# Patient Record
Sex: Male | Born: 1980 | Race: Black or African American | Hispanic: No | Marital: Married | Smoking: Never smoker
Health system: Southern US, Community
[De-identification: ages and names within clinical notes are randomized; demographics above are authoritative.]

## PROBLEM LIST (undated history)

## (undated) HISTORY — PX: ABDOMINAL SURGERY: SHX537

## (undated) HISTORY — PX: BACK SURGERY: SHX140

## (undated) HISTORY — PX: TONSILLECTOMY: SUR1361

---
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
## 2000-01-08 ENCOUNTER — Emergency Department (HOSPITAL_COMMUNITY): Admission: EM | Admit: 2000-01-08 | Discharge: 2000-01-08 | Payer: Self-pay

## 2000-08-13 ENCOUNTER — Encounter: Payer: Self-pay | Admitting: Internal Medicine

## 2000-08-13 ENCOUNTER — Emergency Department (HOSPITAL_COMMUNITY): Admission: EM | Admit: 2000-08-13 | Discharge: 2000-08-13 | Payer: Self-pay | Admitting: *Deleted

## 2001-08-02 ENCOUNTER — Emergency Department (HOSPITAL_COMMUNITY): Admission: EM | Admit: 2001-08-02 | Discharge: 2001-08-02 | Payer: Self-pay | Admitting: *Deleted

## 2001-09-28 ENCOUNTER — Emergency Department (HOSPITAL_COMMUNITY): Admission: EM | Admit: 2001-09-28 | Discharge: 2001-09-28 | Payer: Self-pay | Admitting: Emergency Medicine

## 2001-12-17 ENCOUNTER — Emergency Department (HOSPITAL_COMMUNITY): Admission: EM | Admit: 2001-12-17 | Discharge: 2001-12-17 | Payer: Self-pay | Admitting: Emergency Medicine

## 2001-12-17 ENCOUNTER — Encounter: Payer: Self-pay | Admitting: Emergency Medicine

## 2002-05-15 ENCOUNTER — Emergency Department (HOSPITAL_COMMUNITY): Admission: EM | Admit: 2002-05-15 | Discharge: 2002-05-16 | Payer: Self-pay | Admitting: Emergency Medicine

## 2002-05-16 ENCOUNTER — Encounter: Payer: Self-pay | Admitting: Emergency Medicine

## 2002-06-06 ENCOUNTER — Emergency Department (HOSPITAL_COMMUNITY): Admission: EM | Admit: 2002-06-06 | Discharge: 2002-06-06 | Payer: Self-pay | Admitting: Emergency Medicine

## 2002-07-06 ENCOUNTER — Emergency Department (HOSPITAL_COMMUNITY): Admission: EM | Admit: 2002-07-06 | Discharge: 2002-07-06 | Payer: Self-pay | Admitting: Emergency Medicine

## 2002-09-11 ENCOUNTER — Encounter: Payer: Self-pay | Admitting: Emergency Medicine

## 2002-09-11 ENCOUNTER — Emergency Department (HOSPITAL_COMMUNITY): Admission: EM | Admit: 2002-09-11 | Discharge: 2002-09-12 | Payer: Self-pay | Admitting: Emergency Medicine

## 2002-09-15 ENCOUNTER — Emergency Department (HOSPITAL_COMMUNITY): Admission: EM | Admit: 2002-09-15 | Discharge: 2002-09-15 | Payer: Self-pay | Admitting: Emergency Medicine

## 2002-09-26 ENCOUNTER — Encounter: Payer: Self-pay | Admitting: Emergency Medicine

## 2002-09-26 ENCOUNTER — Emergency Department (HOSPITAL_COMMUNITY): Admission: EM | Admit: 2002-09-26 | Discharge: 2002-09-26 | Payer: Self-pay | Admitting: Emergency Medicine

## 2002-10-20 ENCOUNTER — Emergency Department (HOSPITAL_COMMUNITY): Admission: EM | Admit: 2002-10-20 | Discharge: 2002-10-20 | Payer: Self-pay | Admitting: Emergency Medicine

## 2002-10-20 ENCOUNTER — Encounter: Payer: Self-pay | Admitting: Emergency Medicine

## 2005-03-18 ENCOUNTER — Emergency Department (HOSPITAL_COMMUNITY): Admission: EM | Admit: 2005-03-18 | Discharge: 2005-03-18 | Payer: Self-pay | Admitting: Emergency Medicine

## 2006-08-04 ENCOUNTER — Inpatient Hospital Stay (HOSPITAL_COMMUNITY): Admission: EM | Admit: 2006-08-04 | Discharge: 2006-08-06 | Payer: Self-pay | Admitting: Emergency Medicine

## 2007-08-19 ENCOUNTER — Emergency Department (HOSPITAL_COMMUNITY): Admission: EM | Admit: 2007-08-19 | Discharge: 2007-08-19 | Payer: Self-pay | Admitting: Emergency Medicine
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
## 2010-06-09 NOTE — Discharge Summary (Signed)
NAMEGARRIE, Connor Mcdonald                 ACCOUNT NO.:  [id number]   MEDICAL RECORD NO.:  [id number]          PATIENT TYPE:  INP   LOCATION:  1609                         FACILITY:  [facility]   PHYSICIAN:  Madaline Savage, MD        DATE OF BIRTH:  05/12/1980   DATE OF ADMISSION:  08/04/2006  DATE OF DISCHARGE:  08/06/2006                               DISCHARGE SUMMARY   PRIMARY CARE PHYSICIAN:  None.  The patient is unassigned to [place].   DISCHARGE DIAGNOSES:  1. New onset diabetes mellitus, type 2.  2. New onset hypertension.  3. Moderate obesity.   DISCHARGE MEDICATIONS:  1. Lantus insulin 15 units subcutaneous at bedtime.  2. Metformin 500 mg twice daily.  3. Lisinopril 10 mg once daily.   HISTORY OF PRESENT ILLNESS:  For a full history and physical see the  history and physical dictated by Dr. Gasper Sells on August 04, 2006.  In  short, Connor Mcdonald is a 30 year old gentleman who came to the ER with  complaints of polyuria and polydipsia.  In the ER, he was found to have  a blood sugar of 399 and was admitted with a diagnosis of new onset  diabetes mellitus.  He was also found to have a blood pressure of  185/124.   PROBLEM LIST:  1. New onset diabetes mellitus.  Connor Mcdonald has not seen a doctor for      the last 7-8 years.  He states he has had polyuria and polydipsia      for a long time.  During this hospital stay his blood sugar was in      the 200 and 300 range all the time.  We started him on insulin and      Glucophage.  Considering his high blood sugars, he will probably      need insulin long term.  Hemoglobin A1c in the hospital came back      at 11.9.  His TSH was within normal limits.  He was given an      insulin starter kit and was trained how to take his own insulin      shots.  He was also seen by the diabetic education.  He was given      dietary education.  He will follow up with his primary care doctor.  2. Hypertension.  He was started on Lisinopril and his blood pressure  has been controlled.  At the time of discharge, it is 134/70 at      this time.  We will continue him on the Lisinopril which can be      titrated as an outpatient.   DISPOSITION:  He is now being discharged home in a stable condition.   FOLLOWUP:  We got an appointment for him with Dr. Jackie Plum at  [facility], and he will see Dr. Julio Sicks on Wednesday,  August 10, 2006 at 10:45 a.m.      Madaline Savage, MD  Electronically Signed     PKN/MEDQ  D:  08/06/2006  T:  08/07/2006  Job:  [id number]   cc:   Jackie Plum, M.D.  Fax: [phone]

## 2010-10-23 LAB — URINALYSIS, ROUTINE W REFLEX MICROSCOPIC
Glucose, UA: >1000 — AB
Hgb urine dipstick: NEGATIVE
Protein, ur: NEGATIVE
Specific Gravity, Urine: 1.04 — ABNORMAL HIGH
pH: 6.5

## 2010-10-23 LAB — URINE MICROSCOPIC-ADD ON

## 2010-10-23 LAB — DIFFERENTIAL
Basophils Absolute: 0.1
Basophils Relative: 1
Eosinophils Absolute: 0.3
Eosinophils Relative: 2
Lymphocytes Relative: 23

## 2010-10-23 LAB — CBC
HCT: 44.3
MCHC: 33.3
Platelets: 255
RBC: 5.23
RDW: 12
WBC: 17.2 — ABNORMAL HIGH

## 2010-10-23 LAB — BASIC METABOLIC PANEL
Calcium: 9.5
Potassium: 4

## 2010-10-23 LAB — RAPID STREP SCREEN (MED CTR MEBANE ONLY): Streptococcus, Group A Screen (Direct): NEGATIVE

## 2010-11-10 LAB — BASIC METABOLIC PANEL
BUN: 8
CO2: 27
Calcium: 9
Chloride: 99
Creatinine, Ser: 1.01
GFR calc Af Amer: >60
GFR calc non Af Amer: >60
Glucose, Bld: 309 — ABNORMAL HIGH
Potassium: 3.9
Sodium: 134 — ABNORMAL LOW

## 2010-11-10 LAB — CBC
HCT: 38 — ABNORMAL LOW
HCT: 42.4
Hemoglobin: 13
MCHC: 33
MCHC: 34.1
MCV: 81.4
MCV: 82.2
Platelets: 219
RBC: 4.67
RBC: 5.16
RDW: 12.4
WBC: 11.9 — ABNORMAL HIGH
WBC: 9.4

## 2010-11-10 LAB — DIFFERENTIAL
Basophils Absolute: 0.1
Eosinophils Absolute: 0.5
Eosinophils Relative: 4
Lymphocytes Relative: 26
Lymphs Abs: 3.1
Monocytes Relative: 6
Neutro Abs: 7.5

## 2010-11-10 LAB — COMPREHENSIVE METABOLIC PANEL
ALT: 41
AST: 31
Albumin: 3.8
BUN: 9
Calcium: 9.7
Chloride: 95 — ABNORMAL LOW
Creatinine, Ser: 1.15
GFR calc Af Amer: >60
Potassium: 4.2
Total Bilirubin: 0.8

## 2010-11-10 LAB — URINALYSIS, ROUTINE W REFLEX MICROSCOPIC
Glucose, UA: >1000 — AB
Specific Gravity, Urine: 1.042 — ABNORMAL HIGH
Urobilinogen, UA: 0.2

## 2010-11-10 LAB — HEMOGLOBIN: Hemoglobin: 12.3 — ABNORMAL LOW

## 2010-11-10 LAB — TSH: TSH: 1.279

## 2010-11-10 LAB — GLUCOSE, RANDOM: Glucose, Bld: 369 — ABNORMAL HIGH

## 2011-05-12 ENCOUNTER — Encounter (HOSPITAL_BASED_OUTPATIENT_CLINIC_OR_DEPARTMENT_OTHER): Payer: Self-pay | Admitting: *Deleted

## 2011-05-12 ENCOUNTER — Emergency Department (HOSPITAL_BASED_OUTPATIENT_CLINIC_OR_DEPARTMENT_OTHER)
Admission: EM | Admit: 2011-05-12 | Discharge: 2011-05-12 | Disposition: A | Discharge status: Home / Self Care | Payer: BC Managed Care – PPO | Attending: Emergency Medicine | Admitting: Emergency Medicine

## 2011-05-12 DIAGNOSIS — R739 Hyperglycemia, unspecified: Secondary | ICD-10-CM

## 2011-05-12 DIAGNOSIS — R350 Frequency of micturition: Secondary | ICD-10-CM | POA: Insufficient documentation

## 2011-05-12 DIAGNOSIS — R51 Headache: Secondary | ICD-10-CM | POA: Insufficient documentation

## 2011-05-12 DIAGNOSIS — E119 Type 2 diabetes mellitus without complications: Secondary | ICD-10-CM | POA: Insufficient documentation

## 2011-05-12 LAB — URINALYSIS, ROUTINE W REFLEX MICROSCOPIC
Bilirubin Urine: NEGATIVE
Glucose, UA: >1000 mg/dL — AB
Hgb urine dipstick: NEGATIVE
Ketones, ur: 15 mg/dL — AB
pH: 5 (ref 5.0–8.0)

## 2011-05-12 LAB — CBC
HCT: 39.9 % (ref 39.0–52.0)
Hemoglobin: 14 g/dL (ref 13.0–17.0)
MCH: 28.5 pg (ref 26.0–34.0)
MCHC: 35.1 g/dL (ref 30.0–36.0)
RBC: 4.91 MIL/uL (ref 4.22–5.81)

## 2011-05-12 LAB — BASIC METABOLIC PANEL
BUN: 15 mg/dL (ref 6–23)
Chloride: 98 mEq/L (ref 96–112)
Creatinine, Ser: 0.9 mg/dL (ref 0.50–1.35)
GFR calc Af Amer: >90 mL/min (ref >90)
GFR calc non Af Amer: >90 mL/min (ref >90)
Glucose, Bld: 271 mg/dL — ABNORMAL HIGH (ref 70–99)

## 2011-05-12 LAB — DIFFERENTIAL
Basophils Relative: 1 % (ref 0–1)
Eosinophils Absolute: 0.3 10*3/uL (ref 0.0–0.7)
Lymphs Abs: 3.6 10*3/uL (ref 0.7–4.0)
Monocytes Absolute: 1.1 10*3/uL — ABNORMAL HIGH (ref 0.1–1.0)
Monocytes Relative: 9 % (ref 3–12)

## 2011-05-12 MED ORDER — KETOROLAC TROMETHAMINE 30 MG/ML IJ SOLN
30.0000 mg | Freq: Once | INTRAMUSCULAR | Status: AC
  Administered 2011-05-12: 30 mg via INTRAVENOUS
  Filled 2011-05-12: qty 1

## 2011-05-12 MED ORDER — SODIUM CHLORIDE 0.9 % IV BOLUS (SEPSIS)
1000.0000 mL | Freq: Once | INTRAVENOUS | Status: AC
  Administered 2011-05-12: 1000 mL via INTRAVENOUS

## 2011-05-12 MED ORDER — DIPHENHYDRAMINE HCL 50 MG/ML IJ SOLN
12.5000 mg | Freq: Once | INTRAMUSCULAR | Status: AC
  Administered 2011-05-12: 12.5 mg via INTRAVENOUS
  Filled 2011-05-12: qty 1

## 2011-05-12 MED ORDER — METOCLOPRAMIDE HCL 5 MG/ML IJ SOLN
10.0000 mg | Freq: Once | INTRAMUSCULAR | Status: AC
  Administered 2011-05-12: 10 mg via INTRAVENOUS
  Filled 2011-05-12: qty 2

## 2011-05-12 MED ORDER — GLIPIZIDE 10 MG PO TABS: 10.0000 mg | ORAL_TABLET | Freq: Two times a day (BID) | ORAL | Status: DC

## 2011-05-12 MED ORDER — METFORMIN HCL 1000 MG PO TABS: 1000.0000 mg | ORAL_TABLET | Freq: Two times a day (BID) | ORAL | Status: DC

## 2011-05-12 NOTE — ED Notes (Signed)
Took patients' CBG and result was 190. Notified Nurse of result.

## 2011-05-12 NOTE — ED Notes (Signed)
Pt c/o increased BS , urine freq and thirst. HX DM w/o meds x 1 year

## 2011-05-12 NOTE — ED Provider Notes (Signed)
History     CSN: [id number]  Arrival date & time 05/12/11  1655   None     Chief Complaint  Patient presents with  . Hyperglycemia    (Consider location/radiation/quality/duration/timing/severity/associated sxs/prior treatment) Patient is a 31 y.o. male presenting with diabetes problem. The history is provided by the patient. No language interpreter was used.  Diabetes He has type 2 diabetes mellitus. Hypoglycemia symptoms include headaches. Associated symptoms include polydipsia, polyphagia and polyuria. Symptoms are worsening. Risk factors for coronary artery disease include diabetes mellitus. When asked about current treatments, none were reported. He is following a high fat/cholesterol diet. When asked about meal planning, he reported none. He has not had a previous visit with a dietician. He does not see a podiatrist.Eye exam is not current.   patient reports he is diabetic he has not taken any medicines for his diabetes and over year he was previously followed by Dr. Julio Sicks. Patient reports he has had increased urination recently he has had increased thirst. Patient reports he has been on medicines and insulin in the past for his diabetes. He reports he has lost 100 pounds in the last year in an attempt to decrease his blood sugars. Patient reports that a normal blood sugar for him is between 305 100 patient reports when he drops below 300 he feels sick and feels like he is going to pass out. Patient reports he normally does not feel any symptoms from his diabetes until his sugar is over 700. The patient explains to me that because of the way he feels when his sugar drops below 308 thinks it is supposed stay in that range.  Past Medical History  Diagnosis Date  . Diabetes mellitus     Past Surgical History  Procedure Date  . Abdominal surgery   . Back surgery   . Tonsillectomy     History reviewed. No pertinent family history.  History  Substance Use Topics  . Smoking  status: Never Smoker   . Smokeless tobacco: Not on file  . Alcohol Use: No      Review of Systems  Genitourinary: Positive for polyuria and frequency.  Neurological: Positive for headaches.  Hematological: Positive for polydipsia and polyphagia.  All other systems reviewed and are negative.    Allergies  Amoxil  Home Medications  No current outpatient prescriptions on file.  BP 174/96  Pulse 83  Temp(Src) 98.4 F (36.9 C) (Oral)  Resp 18  Ht 6\' 4"  (1.93 m)  Wt 413 lb (187.336 kg)  BMI 50.27 kg/m2  SpO2 100%  Physical Exam  Nursing note and vitals reviewed. Constitutional: He appears well-developed and well-nourished.  HENT:  Head: Normocephalic and atraumatic.  Right Ear: External ear normal.  Left Ear: External ear normal.  Nose: Nose normal.  Mouth/Throat: Oropharynx is clear and moist.  Eyes: Conjunctivae are normal. Pupils are equal, round, and reactive to light.  Neck: Normal range of motion. Neck supple.  Cardiovascular: Normal rate and normal heart sounds.   Pulmonary/Chest: Effort normal.  Abdominal: Soft.  Musculoskeletal: Normal range of motion.  Neurological: He is alert.  Psychiatric: He has a normal mood and affect.    ED Course  Procedures (including critical care time)  Labs Reviewed  GLUCOSE, CAPILLARY - Abnormal; Notable for the following:    Glucose-Capillary 279 (*)    All other components within normal limits  URINALYSIS, ROUTINE W REFLEX MICROSCOPIC  CBC  DIFFERENTIAL  BASIC METABOLIC PANEL   No results found.  No diagnosis found.    MDM  Patient given IV fluids. Labs returned CO2 is 26. Glucose is 271 patient given Reglan Benadryl and Toradol for his headache reports this headache feels better. I counseled patient on his diabetes. I attempted to explain that a normal glucose is not between 300-500. Patient was provided with diabetes information, I referred him to Dr. Rodena Medin. Patient states he cannot take Glucophage because  it makes his sugar dropped too fast he agrees to taking an Rx of Glucotrol. Counseled patient on the risk of his continued elevated blood glucoses patient understands the risk of renal failure cardiac disease neuropathy and death.        Lonia Skinner [place], [place] 05/12/11 2255

## 2011-05-12 NOTE — Discharge Instructions (Signed)
Diabetes and Exercise Regular exercise is important and can help:   Control blood glucose (sugar).   Decrease blood pressure.    Control blood lipids (cholesterol, triglycerides).   Improve overall health.  BENEFITS FROM EXERCISE  Improved fitness.   Improved flexibility.   Improved endurance.   Increased bone density.   Weight control.   Increased muscle strength.   Decreased body fat.   Improvement of the body's use of insulin, a hormone.   Increased insulin sensitivity.   Reduction of insulin needs.   Reduced stress and tension.   Helps you feel better.  People with diabetes who add exercise to their lifestyle gain additional benefits, including:  Weight loss.   Reduced appetite.   Improvement of the body's use of blood glucose.   Decreased risk factors for heart disease:   Lowering of cholesterol and triglycerides.   Raising the level of good cholesterol (high-density lipoproteins, HDL).   Lowering blood sugar.   Decreased blood pressure.  TYPE 1 DIABETES AND EXERCISE  Exercise will usually lower your blood glucose.   If blood glucose is greater than 240 mg/dl, check urine ketones. If ketones are present, do not exercise.   Location of the insulin injection sites may need to be adjusted with exercise. Avoid injecting insulin into areas of the body that will be exercised. For example, avoid injecting insulin into:   The arms when playing tennis.   The legs when jogging. For more information, discuss this with your caregiver.   Keep a record of:   Food intake.   Type and amount of exercise.   Expected peak times of insulin action.   Blood glucose levels.  Do this before, during, and after exercise. Review your records with your caregiver. This will help you to develop guidelines for adjusting food intake and insulin amounts.  TYPE 2 DIABETES AND EXERCISE  Regular physical activity can help control blood glucose.   Exercise is important  because it may:   Increase the body's sensitivity to insulin.   Improve blood glucose control.   Exercise reduces the risk of heart disease. It decreases serum cholesterol and triglycerides. It also lowers blood pressure.   Those who take insulin or oral hypoglycemic agents should watch for signs of hypoglycemia. These signs include dizziness, shaking, sweating, chills, and confusion.   Body water is lost during exercise. It must be replaced. This will help to avoid loss of body fluids (dehydration) or heat stroke.  Be sure to talk to your caregiver before starting an exercise program to make sure it is safe for you. Remember, any activity is better than none.  Document Released: 04/03/2003 Document Revised: 12/31/2010 Document Reviewed: 07/18/2008 [facility] Patient Information 2012 [place], [place].Hyperglycemia Hyperglycemia occurs when the glucose (sugar) in your blood is too high. Hyperglycemia can happen for many reasons, but it most often happens to people who do not know they have diabetes or are not managing their diabetes properly.  CAUSES  Whether you have diabetes or not, there are other causes of hyperglycemia. Hyperglycemia can occur when you have diabetes, but it can also occur in other situations that you might not be as aware of, such as: Diabetes If you have diabetes and are having problems controlling your blood glucose, hyperglycemia could occur because of some of the following reasons:  Not following your meal plan.  Not taking your diabetes medications or not taking it properly.  Exercising less or doing less activity than you normally do.  Being sick.  Pre-diabetes This cannot be ignored. Before people develop Type 2 diabetes, they almost always have "pre-diabetes." This is when your blood glucose levels are higher than normal, but not yet high enough to be diagnosed as diabetes. Research has shown that some long-term damage to the body, especially the heart and  circulatory system, may already be occurring during pre-diabetes. If you take action to manage your blood glucose when you have pre-diabetes, you may delay or prevent Type 2 diabetes from developing.  Stress If you have diabetes, you may be "diet" controlled or on oral medications or insulin to control your diabetes. However, you may find that your blood glucose is higher than usual in the hospital whether you have diabetes or not. This is often referred to as "stress hyperglycemia." Stress can elevate your blood glucose. This happens because of hormones put out by the body during times of stress. If stress has been the cause of your high blood glucose, it can be followed regularly by your caregiver. That way he/she can make sure your hyperglycemia does not continue to get worse or progress to diabetes.  Steroids Steroids are medications that act on the infection fighting system (immune system) to block inflammation or infection. One side effect can be a rise in blood glucose. Most people can produce enough extra insulin to allow for this rise, but for those who cannot, steroids make blood glucose levels go even higher. It is not unusual for steroid treatments to "uncover" diabetes that is developing. It is not always possible to determine if the hyperglycemia will go away after the steroids are stopped. A special blood test called an A1c is sometimes done to determine if your blood glucose was elevated before the steroids were started.  SYMPTOMS Thirsty.  Frequent urination.  Dry mouth.  Blurred vision.  Tired or fatigue.  Weakness.  Sleepy.  Tingling in feet or leg.  DIAGNOSIS  Diagnosis is made by monitoring blood glucose in one or all of the following ways: A1c test. This is a chemical found in your blood.  Fingerstick blood glucose monitoring.  Laboratory results.  TREATMENT  First, knowing the cause of the hyperglycemia is important before the hyperglycemia can be treated. Treatment may  include, but is not be limited to: Education.  Change or adjustment in medications.  Change or adjustment in meal plan.  Treatment for an illness, infection, etc.  More frequent blood glucose monitoring.  Change in exercise plan.  Decreasing or stopping steroids.  Lifestyle changes.  HOME CARE INSTRUCTIONS  Test your blood glucose as directed.  Exercise regularly. Your caregiver will give you instructions about exercise. Pre-diabetes or diabetes which comes on with stress is helped by exercising.  Eat wholesome, balanced meals. Eat often and at regular, fixed times. Your caregiver or nutritionist will give you a meal plan to guide your sugar intake.  Being at an ideal weight is important. If needed, losing as little as 10 to 15 pounds may help improve blood glucose levels.  SEEK MEDICAL CARE IF:  You have questions about medicine, activity, or diet.  You continue to have symptoms (problems such as increased thirst, urination, or weight gain).  SEEK IMMEDIATE MEDICAL CARE IF:  You are vomiting or have diarrhea.  Your breath smells fruity.  You are breathing faster or slower.  You are very sleepy or incoherent.  You have numbness, tingling, or pain in your feet or hands.  You have chest pain.  Your symptoms get worse even though you have  been following your caregiver's orders.  If you have any other questions or concerns.  Document Released: 07/07/2000 Document Revised: 12/31/2010 Document Reviewed: 09/02/2008 [facility] Patient Information 2012 [place], [place].

## 2011-05-13 LAB — GLUCOSE, CAPILLARY: Glucose-Capillary: 190 mg/dL — ABNORMAL HIGH (ref 70–99)

## 2011-05-13 NOTE — ED Provider Notes (Signed)
Medical screening examination/treatment/procedure(s) were performed by non-physician practitioner and as supervising physician I was immediately available for consultation/collaboration.   Artice Bergerson A Sakara Lehtinen, MD 05/13/11 0010 

## 2018-10-02 ENCOUNTER — Emergency Department (HOSPITAL_COMMUNITY)
Admission: EM | Admit: 2018-10-02 | Discharge: 2018-10-02 | Discharge status: Against Medical Advice | Payer: 59 | Attending: Emergency Medicine | Admitting: Emergency Medicine

## 2018-10-02 ENCOUNTER — Other Ambulatory Visit: Payer: Self-pay

## 2018-10-02 ENCOUNTER — Emergency Department (HOSPITAL_COMMUNITY): Payer: 59

## 2018-10-02 ENCOUNTER — Encounter (HOSPITAL_COMMUNITY): Payer: Self-pay

## 2018-10-02 DIAGNOSIS — E860 Dehydration: Secondary | ICD-10-CM | POA: Diagnosis present

## 2018-10-02 DIAGNOSIS — E119 Type 2 diabetes mellitus without complications: Secondary | ICD-10-CM | POA: Insufficient documentation

## 2018-10-02 DIAGNOSIS — Z7984 Long term (current) use of oral hypoglycemic drugs: Secondary | ICD-10-CM | POA: Diagnosis not present

## 2018-10-02 DIAGNOSIS — R059 Cough, unspecified: Secondary | ICD-10-CM

## 2018-10-02 DIAGNOSIS — E871 Hypo-osmolality and hyponatremia: Secondary | ICD-10-CM | POA: Insufficient documentation

## 2018-10-02 DIAGNOSIS — R7989 Other specified abnormal findings of blood chemistry: Secondary | ICD-10-CM

## 2018-10-02 DIAGNOSIS — U071 COVID-19: Secondary | ICD-10-CM | POA: Diagnosis not present

## 2018-10-02 DIAGNOSIS — R739 Hyperglycemia, unspecified: Secondary | ICD-10-CM

## 2018-10-02 DIAGNOSIS — R05 Cough: Secondary | ICD-10-CM

## 2018-10-02 LAB — CBC WITH DIFFERENTIAL/PLATELET
Abs Immature Granulocytes: 0.01 10*3/uL (ref 0.00–0.07)
Basophils Absolute: 0 10*3/uL (ref 0.0–0.1)
Basophils Relative: 0 %
Eosinophils Absolute: 0 10*3/uL (ref 0.0–0.5)
Eosinophils Relative: 0 %
HCT: 45.7 % (ref 39.0–52.0)
Hemoglobin: 14.8 g/dL (ref 13.0–17.0)
Immature Granulocytes: 0 %
Lymphocytes Relative: 23 %
Lymphs Abs: 1.1 10*3/uL (ref 0.7–4.0)
MCH: 28 pg (ref 26.0–34.0)
MCHC: 32.4 g/dL (ref 30.0–36.0)
MCV: 86.6 fL (ref 80.0–100.0)
Monocytes Absolute: 0.3 10*3/uL (ref 0.1–1.0)
Monocytes Relative: 5 %
Neutro Abs: 3.5 10*3/uL (ref 1.7–7.7)
Neutrophils Relative %: 72 %
Platelets: 136 10*3/uL — ABNORMAL LOW (ref 150–400)
RBC: 5.28 MIL/uL (ref 4.22–5.81)
RDW: 11.7 % (ref 11.5–15.5)
WBC: 4.9 10*3/uL (ref 4.0–10.5)
nRBC: 0 % (ref 0.0–0.2)

## 2018-10-02 LAB — COMPREHENSIVE METABOLIC PANEL
ALT: 59 U/L — ABNORMAL HIGH (ref 0–44)
AST: 46 U/L — ABNORMAL HIGH (ref 15–41)
Albumin: 3.8 g/dL (ref 3.5–5.0)
Alkaline Phosphatase: 48 U/L (ref 38–126)
Anion gap: 9 (ref 5–15)
BUN: 12 mg/dL (ref 6–20)
CO2: 24 mmol/L (ref 22–32)
Calcium: 8.5 mg/dL — ABNORMAL LOW (ref 8.9–10.3)
Chloride: 95 mmol/L — ABNORMAL LOW (ref 98–111)
Creatinine, Ser: 0.97 mg/dL (ref 0.61–1.24)
GFR calc Af Amer: >60 mL/min (ref >60)
GFR calc non Af Amer: >60 mL/min (ref >60)
Glucose, Bld: 297 mg/dL — ABNORMAL HIGH (ref 70–99)
Potassium: 4.2 mmol/L (ref 3.5–5.1)
Sodium: 128 mmol/L — ABNORMAL LOW (ref 135–145)
Total Bilirubin: 1.1 mg/dL (ref 0.3–1.2)
Total Protein: 7.5 g/dL (ref 6.5–8.1)

## 2018-10-02 LAB — SARS CORONAVIRUS 2 BY RT PCR (HOSPITAL ORDER, PERFORMED IN ~~LOC~~ HOSPITAL LAB): SARS Coronavirus 2: POSITIVE — AB

## 2018-10-02 LAB — LACTIC ACID, PLASMA
Lactic Acid, Venous: 1.5 mmol/L (ref 0.5–1.9)
Lactic Acid, Venous: 1.4 mmol/L (ref 0.5–1.9)

## 2018-10-02 LAB — URINALYSIS, ROUTINE W REFLEX MICROSCOPIC
Bacteria, UA: NONE SEEN
Bilirubin Urine: NEGATIVE
Glucose, UA: >=500 mg/dL — AB
Hgb urine dipstick: NEGATIVE
Ketones, ur: 20 mg/dL — AB
Leukocytes,Ua: NEGATIVE
Nitrite: NEGATIVE
Protein, ur: 100 mg/dL — AB
Specific Gravity, Urine: 1.024 (ref 1.005–1.030)
pH: 6 (ref 5.0–8.0)

## 2018-10-02 LAB — LIPASE, BLOOD: Lipase: 27 U/L (ref 11–51)

## 2018-10-02 LAB — CBG MONITORING, ED: Glucose-Capillary: 294 mg/dL — ABNORMAL HIGH (ref 70–99)

## 2018-10-02 MED ORDER — ACETAMINOPHEN 325 MG PO TABS
650.0000 mg | ORAL_TABLET | Freq: Once | ORAL | Status: AC
  Administered 2018-10-02: 650 mg via ORAL
  Filled 2018-10-02: qty 2

## 2018-10-02 MED ORDER — SODIUM CHLORIDE 0.9 % IV BOLUS
1000.0000 mL | Freq: Once | INTRAVENOUS | Status: AC
  Administered 2018-10-02: 1000 mL via INTRAVENOUS

## 2018-10-02 NOTE — Discharge Instructions (Signed)
Person Under Monitoring Name: Connor Mcdonald  [place] [address] [address] [place] [id number]   Infection Prevention Recommendations for Individuals Confirmed to have, or Being Evaluated for, 2019 Novel Coronavirus (COVID-19) Infection Who Receive Care at Home  Individuals who are confirmed to have, or are being evaluated for, COVID-19 should follow the prevention steps below until a healthcare provider or local or state health department says they can return to normal activities.  Stay home except to get medical care You should restrict activities outside your home, except for getting medical care. Do not go to work, school, or public areas, and do not use public transportation or taxis.  Call ahead before visiting your doctor Before your medical appointment, call the healthcare provider and tell them that you have, or are being evaluated for, COVID-19 infection. This will help the healthcare providers office take steps to keep other people from getting infected. Ask your healthcare provider to call the local or state health department.  Monitor your symptoms Seek prompt medical attention if your illness is worsening (e.g., difficulty breathing). Before going to your medical appointment, call the healthcare provider and tell them that you have, or are being evaluated for, COVID-19 infection. Ask your healthcare provider to call the local or state health department.  Wear a facemask You should wear a facemask that covers your nose and mouth when you are in the same room with other people and when you visit a healthcare provider. People who live with or visit you should also wear a facemask while they are in the same room with you.  Separate yourself from other people in your home As much as possible, you should stay in a different room from other people in your home. Also, you should use a separate bathroom, if available.  Avoid sharing household items You should not share  dishes, drinking glasses, cups, eating utensils, towels, bedding, or other items with other people in your home. After using these items, you should wash them thoroughly with soap and water.  Cover your coughs and sneezes Cover your mouth and nose with a tissue when you cough or sneeze, or you can cough or sneeze into your sleeve. Throw used tissues in a lined trash can, and immediately wash your hands with soap and water for at least 20 seconds or use an alcohol-based hand rub.  Wash your [employer]hands Wash your hands often and thoroughly with soap and water for at least 20 seconds. You can use an alcohol-based hand sanitizer if soap and water are not available and if your hands are not visibly dirty. Avoid touching your eyes, nose, and mouth with unwashed hands.   Prevention Steps for Caregivers and Household Members of Individuals Confirmed to have, or Being Evaluated for, COVID-19 Infection Being Cared for in the Home  If you live with, or provide care at home for, a person confirmed to have, or being evaluated for, COVID-19 infection please follow these guidelines to prevent infection:  Follow healthcare providers instructions Make sure that you understand and can help the patient follow any healthcare provider instructions for all care.  Provide for the patients basic needs You should help the patient with basic needs in the home and provide support for getting groceries, prescriptions, and other personal needs.  Monitor the patients symptoms If they are getting sicker, call his or her medical provider and tell them that the patient has, or is being evaluated for, COVID-19 infection. This will help the healthcare providers office  take steps to keep other people from getting infected. °Ask the healthcare provider to call the local or state health department. ° °Limit the number of people who have contact with the patient °If possible, have only one caregiver for the patient. °Other  household members should stay in another home or place of residence. If this is not possible, they should stay °in another room, or be separated from the patient as much as possible. Use a separate bathroom, if available. °Restrict visitors who do not have an essential need to be in the home. ° °Keep older adults, very young children, and other sick people away from the patient °Keep older adults, very young children, and those who have compromised immune systems or chronic health conditions away from the patient. This includes people with chronic heart, lung, or kidney conditions, diabetes, and cancer. ° °Ensure good ventilation °Make sure that shared spaces in the home have good air flow, such as from an air conditioner or an opened window, °weather permitting. ° °Wash your hands often °Wash your hands often and thoroughly with soap and water for at least 20 seconds. You can use an alcohol based hand sanitizer if soap and water are not available and if your hands are not visibly dirty. °Avoid touching your eyes, nose, and mouth with unwashed hands. °Use disposable paper towels to dry your hands. If not available, use dedicated cloth towels and replace them when they become wet. ° °Wear a facemask and gloves °Wear a disposable facemask at all times in the room and gloves when you touch or have contact with the patient’s blood, body fluids, and/or secretions or excretions, such as sweat, saliva, sputum, nasal mucus, vomit, urine, or feces.  Ensure the mask fits over your nose and mouth tightly, and do not touch it during use. °Throw out disposable facemasks and gloves after using them. Do not reuse. °Wash your hands immediately after removing your facemask and gloves. °If your personal clothing becomes contaminated, carefully remove clothing and launder. Wash your hands after handling contaminated clothing. °Place all used disposable facemasks, gloves, and other waste in a lined container before disposing them with  other household waste. °Remove gloves and wash your hands immediately after handling these items. ° °Do not share dishes, glasses, or other household items with the patient °Avoid sharing household items. You should not share dishes, drinking glasses, cups, eating utensils, towels, bedding, or other items with a patient who is confirmed to have, or being evaluated for, COVID-19 infection. °After the person uses these items, you should wash them thoroughly with soap and water. ° °Wash laundry thoroughly °Immediately remove and wash clothes or bedding that have blood, body fluids, and/or secretions or excretions, such as sweat, saliva, sputum, nasal mucus, vomit, urine, or feces, on them. °Wear gloves when handling laundry from the patient. °Read and follow directions on labels of laundry or clothing items and detergent. In general, wash and dry with the warmest temperatures recommended on the label. ° °Clean all areas the individual has used often °Clean all touchable surfaces, such as counters, tabletops, doorknobs, bathroom fixtures, toilets, phones, keyboards, tablets, and bedside tables, every day. Also, clean any surfaces that may have blood, body fluids, and/or secretions or excretions on them. °Wear gloves when cleaning surfaces the patient has come in contact with. °Use a diluted bleach solution (e.g., dilute bleach with 1 part bleach and 10 parts water) or a household disinfectant with a label that says EPA-registered for coronaviruses. To make a bleach   solution at home, add 1 tablespoon of bleach to 1 quart (4 cups) of water. For a larger supply, add  cup of bleach to 1 gallon (16 cups) of water. Read labels of cleaning products and follow recommendations provided on product labels. Labels contain instructions for safe and effective use of the cleaning product including precautions you should take when applying the product, such as wearing gloves or eye protection and making sure you have good ventilation  during use of the product. Remove gloves and wash hands immediately after cleaning.  Monitor yourself for signs and symptoms of illness Caregivers and household members are considered close contacts, should monitor their health, and will be asked to limit movement outside of the home to the extent possible. Follow the monitoring steps for close contacts listed on the symptom monitoring form.   ? If you have additional questions, contact your local health department or call the epidemiologist on call at [phone] (available 24/7). ? This guidance is subject to change. For the most up-to-date guidance from [facility], please refer to their website: YouBlogs.pl  Take your usual prescriptions as previously directed. Your electrolytes were low and your liver function tests were slightly elevated today; your regular medical doctor will need to recheck these levels.  Avoid tylenol, tylenol containing products, and alcohol until you are seen in follow up. Your COVID testing was positive today, and you will need to isolate at home for the next 2 weeks. Call your regular medical doctor tomorrow to schedule a follow up appointment within the next 2 weeks.  Return to the Emergency Department immediately sooner if worsening.

## 2018-10-02 NOTE — ED Notes (Signed)
Ultrasound has just been completed.

## 2018-10-02 NOTE — ED Triage Notes (Addendum)
Pt states he is concerned he is dehydrated. Pt stated that pre-COVID, he used to go to an IV hydration facility. Pt states that he drank 3 gallons of water today and has been frequently voiding. Pt also states that he is scheduled to see his PCP tomorrow for diabetes mgmt. Pt states that his sugar usually is in the 500-600s, but lately has been running lower. Pt states he does not take any insulin. Pt states that he knows he needs fluids.  Pt states he was recently seen for same in [place], pt states he was COVID negative and was not in DKA. Pt also states that he has a cough approx every 30 seconds. No cough throughout my triage.

## 2018-10-02 NOTE — ED Notes (Signed)
Pt ambulated and stayed between 97-100 O2 sat.

## 2018-10-02 NOTE — ED Notes (Signed)
Offered patient PO fluids, pt refused and stated "I know my body and the only thing that will help me is IV fluids."

## 2018-10-02 NOTE — ED Notes (Signed)
Pt still in room, waiting on ride.

## 2018-10-02 NOTE — ED Provider Notes (Signed)
[facility] DEPT Provider Note   CSN: [id number] Arrival date & time: 10/02/18  1605     History   Chief Complaint Chief Complaint  Patient presents with   Dehydration    HPI CHEE KINSLOW is a 38 y.o. male.     HPI  Pt was seen at 28. Per pt, c/o gradual onset and persistence of constant multiple complaints for the past 3 days. Complaints include: abd pain, cough, "feeling dehydrated," increased thirst, increased urination, and fevers. Pt states his CBG's have been "all over the place" for the past several weeks, "running in 500-600's then lower." Pt is due to see his PMD tomorrow for his DM management. Pt states he has drank "3 gallons of water today" but "know I need IV fluids." Pt states he was evaluated by Osborne Oman in [place] this past week for his symptoms, and "got some IV fluids and insulin and felt great for a few days" before his symptoms returned. States his COVID testing was negative at that time, and he was "not in DKA." Pt states he "has been coughing every 30 seconds and his mouth is dry" and he "chewed some gum and that made my mouth wet and my cough stop."  Denies CP/palpitations, no SOB, no rash, no N/V/D, no back pain, no focal motor weakness.     Past Medical History:  Diagnosis Date   Diabetes mellitus     There are no active problems to display for this patient.   Past Surgical History:  Procedure Laterality Date   ABDOMINAL SURGERY     BACK SURGERY     TONSILLECTOMY          Home Medications    Prior to Admission medications   Medication Sig Start Date End Date Taking? Authorizing Provider  glipiZIDE (GLUCOTROL) 10 MG tablet Take 1 tablet (10 mg total) by mouth 2 (two) times daily before a meal. 05/12/11 05/11/12  Fransico Meadow, PA-C  metFORMIN (GLUCOPHAGE) 1000 MG tablet Take 1 tablet (1,000 mg total) by mouth 2 (two) times daily. 05/12/11 05/11/12  Fransico Meadow, PA-C  naproxen sodium (ANAPROX) 220 MG tablet  Take 220 mg by mouth 2 (two) times daily with a meal. Patient used this medication for his headache.    [provider]  Pseudoephedrine-DM-GG-APAP (TYLENOL COLD SEVERE CONGESTION PO) Take 10 mLs by mouth daily as needed. Patient used this medication for his headache.    [provider]    Family History No family history on file.  Social History Social History   Tobacco Use   Smoking status: Never Smoker  Substance Use Topics   Alcohol use: No   Drug use: No     Allergies   Amoxicillin   Review of Systems Review of Systems ROS: Statement: All systems negative except as marked or noted in the HPI; Constitutional: +fever and chills. ; ; Eyes: Negative for eye pain, redness and discharge. ; ; ENMT: Negative for ear pain, hoarseness, nasal congestion, sinus pressure and sore throat. ; ; Cardiovascular: Negative for chest pain, palpitations, diaphoresis, dyspnea and peripheral edema. ; ; Respiratory: +cough. Negative for wheezing and stridor. ; ; Gastrointestinal: +abd pain. Negative for nausea, vomiting, diarrhea, blood in stool, hematemesis, jaundice and rectal bleeding. . ; ; Genitourinary: Negative for dysuria, flank pain and hematuria. ; ; Musculoskeletal: Negative for back pain and neck pain. Negative for swelling and trauma.; ; Skin: Negative for pruritus, rash, abrasions, blisters, bruising and skin lesion.; ;  Neuro: Negative for headache, lightheadedness and neck stiffness. Negative for weakness, altered level of consciousness, altered mental status, extremity weakness, paresthesias, involuntary movement, seizure and syncope.      Physical Exam Updated Vital Signs BP (!) 163/111 (BP Location: Right Arm)    Pulse (!) 107    Temp (!) 101.5 F (38.6 C) (Oral)    Resp 18    Ht 6\' 4"  (1.93 m)    Wt (!) 181.4 kg    SpO2 98%    BMI 48.69 kg/m    Patient Vitals for the past 24 hrs:  BP Temp Temp src Pulse Resp SpO2 Height Weight  10/02/18 1830 (!) 148/93 -- -- 93  -- 94 % -- --  10/02/18 1800 (!) 149/88 -- -- 94 18 93 % -- --  10/02/18 1730 (!) 141/74 -- -- 92 20 94 % -- --  10/02/18 1642 -- (!) 101.5 F (38.6 C) Oral -- -- -- -- --  10/02/18 1630 136/67 -- -- 99 -- 97 % -- --  10/02/18 1616 (!) 163/111 (!) 102.4 F (39.1 C) Oral (!) 107 18 98 % 6\' 4"  (1.93 m) (!) 181.4 kg     Physical Exam 1630: Physical examination:  Nursing notes reviewed; Vital signs and O2 SAT reviewed;  Constitutional: Well developed, Well nourished, Well hydrated, In no acute distress; Head:  Normocephalic, atraumatic; Eyes: EOMI, PERRL, No scleral icterus; ENMT: Mouth and pharynx normal, Mucous membranes moist; Neck: Supple, Full range of motion, No lymphadenopathy; Cardiovascular: Regular rate and rhythm, No gallop; Respiratory: Breath sounds clear & equal bilaterally, No wheezes.  Speaking full sentences with ease, Normal respiratory effort/excursion; Chest: Nontender, Movement normal; Abdomen: Soft, +mild mid-epigastric tenderness to palp. No rebound or guarding. Nondistended, Normal bowel sounds; Genitourinary: No CVA tenderness; Extremities: Peripheral pulses normal, No tenderness, No edema, No calf edema or asymmetry.; Neuro: AA&Ox3, Major CN grossly intact.  Speech clear. No gross focal motor or sensory deficits in extremities.; Skin: Color normal, Warm, Dry.    ED Treatments / Results  Labs (all labs ordered are listed, but only abnormal results are displayed)   EKG None  Radiology   Procedures Procedures (including critical care time)  Medications Ordered in ED Medications  sodium chloride 0.9 % bolus 1,000 mL (has no administration in time range)  acetaminophen (TYLENOL) tablet 650 mg (has no administration in time range)     Initial Impression / Assessment and Plan / ED Course  I have reviewed the triage vital signs and the nursing notes.  Pertinent labs & imaging results that were available during my care of the patient were reviewed by me and  considered in my medical decision making (see chart for details).     MDM Reviewed: previous chart, nursing note and vitals Reviewed previous: labs Interpretation: labs, x-ray and CT scan    Results for orders placed or performed during the hospital encounter of 10/02/18  SARS Coronavirus 2 [facility](Hospital order, Performed in [facility] hospital lab) Nasopharyngeal Nasopharyngeal Swab   Specimen: Nasopharyngeal Swab  Result Value Ref Range   SARS Coronavirus 2 POSITIVE (A) NEGATIVE  Comprehensive metabolic panel  Result Value Ref Range   Sodium 128 (L) 135 - 145 mmol/L   Potassium 4.2 3.5 - 5.1 mmol/L   Chloride 95 (L) 98 - 111 mmol/L   CO2 24 22 - 32 mmol/L   Glucose, Bld 297 (H) 70 - 99 mg/dL   BUN 12 6 - 20 mg/dL   Creatinine, Ser 1.300.97 0.61 -  1.24 mg/dL   Calcium 8.5 (L) 8.9 - 10.3 mg/dL   Total Protein 7.5 6.5 - 8.1 g/dL   Albumin 3.8 3.5 - 5.0 g/dL   AST 46 (H) 15 - 41 U/L   ALT 59 (H) 0 - 44 U/L   Alkaline Phosphatase 48 38 - 126 U/L   Total Bilirubin 1.1 0.3 - 1.2 mg/dL   GFR calc non Af Amer >60 >60 mL/min   GFR calc Af Amer >60 >60 mL/min   Anion gap 9 5 - 15  Lipase, blood  Result Value Ref Range   Lipase 27 11 - 51 U/L  Lactic acid, plasma  Result Value Ref Range   Lactic Acid, Venous 1.5 0.5 - 1.9 mmol/L  Lactic acid, plasma  Result Value Ref Range   Lactic Acid, Venous 1.4 0.5 - 1.9 mmol/L  CBC with Differential  Result Value Ref Range   WBC 4.9 4.0 - 10.5 K/uL   RBC 5.28 4.22 - 5.81 MIL/uL   Hemoglobin 14.8 13.0 - 17.0 g/dL   HCT 16.0 10.9 - 32.3 %   MCV 86.6 80.0 - 100.0 fL   MCH 28.0 26.0 - 34.0 pg   MCHC 32.4 30.0 - 36.0 g/dL   RDW 55.7 32.2 - 02.5 %   Platelets 136 (L) 150 - 400 K/uL   nRBC 0.0 0.0 - 0.2 %   Neutrophils Relative % 72 %   Neutro Abs 3.5 1.7 - 7.7 K/uL   Lymphocytes Relative 23 %   Lymphs Abs 1.1 0.7 - 4.0 K/uL   Monocytes Relative 5 %   Monocytes Absolute 0.3 0.1 - 1.0 K/uL   Eosinophils Relative 0 %   Eosinophils Absolute  0.0 0.0 - 0.5 K/uL   Basophils Relative 0 %   Basophils Absolute 0.0 0.0 - 0.1 K/uL   Immature Granulocytes 0 %   Abs Immature Granulocytes 0.01 0.00 - 0.07 K/uL  Urinalysis, Routine w reflex microscopic  Result Value Ref Range   Color, Urine YELLOW YELLOW   APPearance CLEAR CLEAR   Specific Gravity, Urine 1.024 1.005 - 1.030   pH 6.0 5.0 - 8.0   Glucose, UA >=500 (A) NEGATIVE mg/dL   Hgb urine dipstick NEGATIVE NEGATIVE   Bilirubin Urine NEGATIVE NEGATIVE   Ketones, ur 20 (A) NEGATIVE mg/dL   Protein, ur 427 (A) NEGATIVE mg/dL   Nitrite NEGATIVE NEGATIVE   Leukocytes,Ua NEGATIVE NEGATIVE   RBC / HPF 0-5 0 - 5 RBC/hpf   WBC, UA 0-5 0 - 5 WBC/hpf   Bacteria, UA NONE SEEN NONE SEEN   Mucus PRESENT    Hyaline Casts, UA PRESENT   CBG monitoring, ED  Result Value Ref Range   Glucose-Capillary 294 (H) 70 - 99 mg/dL   US Abdomen Complete Result Date: 10/02/2018 CLINICAL DATA:  Abdominal pain, elevated LFTs EXAM: ABDOMEN ULTRASOUND COMPLETE COMPARISON:  None. FINDINGS: Gallbladder: Incompletely distended. No gallstones, gallbladder wall thickening, pericholecystic fluid or sonographic Murphy sign. Common bile duct: Diameter: 6 mm, upper normal. Liver: Coarsened slightly increased intrahepatic echogenicity, question fatty infiltration, though this can be seen with cirrhosis and certain infiltrative disorders. No discrete intrahepatic mass or contour nodularity. No intrahepatic biliary dilatation. Portal vein is patent on color Doppler imaging with normal direction of blood flow towards the liver. IVC: Normal appearance Pancreas: Incomplete visualization of pancreatic tail. Visualized portions of pancreas normal appearance. Spleen: Normal appearance, 12.0 cm length Right Kidney: Length: 14.6 cm. Normal morphology without mass or hydronephrosis. Left Kidney: Length: 13.5 cm.  Normal morphology without mass or hydronephrosis. Abdominal aorta: Suboptimally visualized due to bowel gas and body habitus.  Other findings: No free fluid IMPRESSION: Probable fatty infiltration of liver as above. Incomplete visualization of pancreatic tail in abdominal aorta. No other definite upper abdominal sonographic abnormalities. Electronically Signed   By: Ulyses SouthwardMark  Boles M.D.   On: 10/02/2018 20:08   Dg Chest Port 1 View Result Date: 10/02/2018 CLINICAL DATA:  Pt states he is concerned he is dehydrated. Pt states that he drank 3 gallons of water today and has been frequently voiding. Pt also states that he has a cough approx every 30 seconds. Pt HX: DM, non smoker EXAM: PORTABLE CHEST 1 VIEW COMPARISON:  08/04/2006 FINDINGS: Heart size is accentuated by portable technique. There are patchy airspace filling opacities bilaterally and predominantly involving the periphery of the lungs. No definite pleural effusions. IMPRESSION: Bilateral infiltrates, favoring infection over pulmonary edema. Electronically Signed   By: Norva PavlovElizabeth  Brown M.D.   On: 10/02/2018 16:58    Jamion Almetta LovelyJ Upham was evaluated in Emergency Department on 10/02/2018 for the symptoms described in the history of present illness. He was evaluated in the context of the global COVID-19 pandemic, which necessitated consideration that the patient might be at risk for infection with the SARS-CoV-2 virus that causes COVID-19. Institutional protocols and algorithms that pertain to the evaluation of patients at risk for COVID-19 are in a state of rapid change based on information released by regulatory bodies including the CDC and federal and state organizations. These policies and algorithms were followed during the patient's care in the ED.    2045:  APAP given for fever.  COVID testing positive. LFT's mildly elevated, US reassuring for no acute process. CBG elevated, but not acidotic with normal AG; Na corrects to 131. BP stable. O2 Sats at rest 92-94% R/A; pt ambulated with O2 Sats 97-100% R/A. Very concerned regarding pt's self report of "drinking 3 gallons of water"  daily and hyponatremia on labs despite correction for elevated glucose.  T/C returned from [facility] Antionette Charpyd, case discussed, including:  HPI, pertinent PM/SHx, VS/PE, dx testing, ED course and treatment:  Agrees regarding excessive water intake resulting in low Na, and CBG's varying, can observation admit if pt agreeable.   2050:   Pt informed re: dx testing results, including new hyponatremia and LFT's elevation, in addition to +COVID, and that I recommend observation admission for further evaluation of his LFT's and CBG/electrolytes.  Pt refuses admission.  I encouraged pt to stay, continues to refuse.  Pt makes his own medical decisions.  Risks of AMA explained to pt, including, but not limited to:  stroke, heart attack, cardiac arrythmia ("irregular heart rate/beat"), "passing out," temporary and/or permanent disability, death.  Pt verb understanding and continues to refuse admission, understanding the consequences of his decision.  I encouraged pt to follow up with his PMD regarding re-check of his LFT's and electrolytes, and return to the ED immediately if symptoms return, or for any other concerns. COVID quarantine instructions given. Pt verb understanding, agreeable.           Final Clinical Impressions(s) / ED Diagnoses   Final diagnoses:  None    ED Discharge Orders    None       Samuel JesterMcManus, Maxximus Gotay, DO 10/06/18 [id number]

## 2018-10-03 ENCOUNTER — Emergency Department (HOSPITAL_COMMUNITY)
Admission: EM | Admit: 2018-10-03 | Discharge: 2018-10-03 | Disposition: A | Discharge status: Home / Self Care | Payer: 59 | Attending: Emergency Medicine | Admitting: Emergency Medicine

## 2018-10-03 ENCOUNTER — Other Ambulatory Visit: Payer: Self-pay

## 2018-10-03 ENCOUNTER — Encounter (HOSPITAL_COMMUNITY): Payer: Self-pay

## 2018-10-03 DIAGNOSIS — Z79899 Other long term (current) drug therapy: Secondary | ICD-10-CM | POA: Insufficient documentation

## 2018-10-03 DIAGNOSIS — R739 Hyperglycemia, unspecified: Secondary | ICD-10-CM

## 2018-10-03 DIAGNOSIS — Z794 Long term (current) use of insulin: Secondary | ICD-10-CM | POA: Insufficient documentation

## 2018-10-03 DIAGNOSIS — U071 COVID-19: Secondary | ICD-10-CM | POA: Insufficient documentation

## 2018-10-03 DIAGNOSIS — E1165 Type 2 diabetes mellitus with hyperglycemia: Secondary | ICD-10-CM | POA: Insufficient documentation

## 2018-10-03 LAB — CBC
HCT: 45.1 % (ref 39.0–52.0)
Hemoglobin: 14.7 g/dL (ref 13.0–17.0)
MCH: 28.3 pg (ref 26.0–34.0)
MCHC: 32.6 g/dL (ref 30.0–36.0)
MCV: 86.7 fL (ref 80.0–100.0)
Platelets: 125 10*3/uL — ABNORMAL LOW (ref 150–400)
RBC: 5.2 MIL/uL (ref 4.22–5.81)
RDW: 11.7 % (ref 11.5–15.5)
WBC: 5.5 10*3/uL (ref 4.0–10.5)
nRBC: 0 % (ref 0.0–0.2)

## 2018-10-03 LAB — COMPREHENSIVE METABOLIC PANEL
ALT: 61 U/L — ABNORMAL HIGH (ref 0–44)
AST: 60 U/L — ABNORMAL HIGH (ref 15–41)
Albumin: 3.5 g/dL (ref 3.5–5.0)
Alkaline Phosphatase: 49 U/L (ref 38–126)
Anion gap: 10 (ref 5–15)
BUN: 14 mg/dL (ref 6–20)
CO2: 26 mmol/L (ref 22–32)
Calcium: 8.5 mg/dL — ABNORMAL LOW (ref 8.9–10.3)
Chloride: 93 mmol/L — ABNORMAL LOW (ref 98–111)
Creatinine, Ser: 1.1 mg/dL (ref 0.61–1.24)
GFR calc Af Amer: >60 mL/min (ref >60)
GFR calc non Af Amer: >60 mL/min (ref >60)
Glucose, Bld: 348 mg/dL — ABNORMAL HIGH (ref 70–99)
Potassium: 4.2 mmol/L (ref 3.5–5.1)
Sodium: 129 mmol/L — ABNORMAL LOW (ref 135–145)
Total Bilirubin: 0.9 mg/dL (ref 0.3–1.2)
Total Protein: 7.3 g/dL (ref 6.5–8.1)

## 2018-10-03 LAB — URINE CULTURE: Culture: NO GROWTH

## 2018-10-03 LAB — CBG MONITORING, ED: Glucose-Capillary: 353 mg/dL — ABNORMAL HIGH (ref 70–99)

## 2018-10-03 MED ORDER — ACETAMINOPHEN 325 MG PO TABS
650.0000 mg | ORAL_TABLET | Freq: Once | ORAL | Status: AC
  Administered 2018-10-03: 18:00:00 650 mg via ORAL
  Filled 2018-10-03: qty 2

## 2018-10-03 MED ORDER — SODIUM CHLORIDE 0.9 % IV BOLUS
500.0000 mL | Freq: Once | INTRAVENOUS | Status: AC
  Administered 2018-10-03: 500 mL via INTRAVENOUS

## 2018-10-03 MED ORDER — SODIUM CHLORIDE 0.9 % IV BOLUS
1000.0000 mL | Freq: Once | INTRAVENOUS | Status: AC
  Administered 2018-10-03: 1000 mL via INTRAVENOUS

## 2018-10-03 MED ORDER — INSULIN ASPART 100 UNIT/ML ~~LOC~~ SOLN
8.0000 [IU] | Freq: Once | SUBCUTANEOUS | Status: AC
  Administered 2018-10-03: 8 [IU] via SUBCUTANEOUS
  Filled 2018-10-03: qty 0.08

## 2018-10-03 NOTE — Discharge Instructions (Signed)
It was our pleasure to provide your ER care today - we hope that you feel better.  Continue insulin. Drink plenty of fluids/water, follow diabetic meal plan, check sugars 4x/day and record values, and follow up with your doctor in the next few days (by telemedicine).   Yesterday's covid test was positive - please quarantine for atleast 1 week after symptoms (cough/fever) resolved - see information sheet.   Return to ER if worse, increased trouble breathing, persistent vomiting, or other concern.

## 2018-10-03 NOTE — ED Provider Notes (Signed)
[facility]-EMERGENCY DEPT Provider Note   CSN: [id number] Arrival date & time: 10/03/18  1400     History   Chief Complaint Chief Complaint  Patient presents with   Hyperglycemia   Fever   Covid +    HPI Connor Mcdonald is a 38 y.o. male.     Patient with recent dx COVID yesterday, hx iddm, presents indicates blood sugar had stayed in 500 range. Symptoms initially began 1 week ago. Intermittent non prod cough. No sore throat. No chest pain or discomfort. +fever. Denies nausea/vomiting. No polyuria or polydipsia.  Patient is breathing comfortably. No leg pain or swelling.   The history is provided by the patient.    Past Medical History:  Diagnosis Date   Diabetes mellitus     There are no active problems to display for this patient.   Past Surgical History:  Procedure Laterality Date   ABDOMINAL SURGERY     BACK SURGERY     TONSILLECTOMY          Home Medications    Prior to Admission medications   Medication Sig Start Date End Date Taking? Authorizing Provider  gabapentin (NEURONTIN) 300 MG capsule Take 300 mg by mouth 3 (three) times daily.  09/18/18  Yes [provider]  ibuprofen (ADVIL) 200 MG tablet Take 1,000 mg by mouth every 6 (six) hours as needed for fever.   Yes [provider]  NOVOLOG FLEXPEN 100 UNIT/ML FlexPen Inject 6 Units into the skin 3 (three) times daily with meals. Per Sliding scale 07/12/18  Yes [provider]  TRESIBA FLEXTOUCH 100 UNIT/ML SOPN FlexTouch Pen Inject 10 Units into the skin at bedtime.  07/12/18  Yes [provider]    Family History History reviewed. No pertinent family history.  Social History Social History   Tobacco Use   Smoking status: Never Smoker   Smokeless tobacco: Never Used  Substance Use Topics   Alcohol use: No   Drug use: No     Allergies   Amoxicillin   Review of Systems Review of Systems  Constitutional: Positive for fever.    HENT: Negative for sore throat.   Eyes: Negative for redness.  Respiratory: Positive for cough. Negative for shortness of breath.   Cardiovascular: Negative for chest pain.  Gastrointestinal: Negative for abdominal pain, diarrhea and vomiting.  Endocrine: Negative for polyuria.  Genitourinary: Negative for dysuria and flank pain.  Musculoskeletal: Negative for back pain and neck pain.  Skin: Negative for rash.  Neurological: Negative for headaches.  Hematological: Does not bruise/bleed easily.  Psychiatric/Behavioral: Negative for confusion.     Physical Exam Updated Vital Signs BP (!) 132/49    Pulse (!) 104    Temp (!) 102.2 F (39 C) (Oral)    Resp 20    SpO2 98%   Physical Exam Vitals signs and nursing note reviewed.  Constitutional:      Appearance: Normal appearance. He is well-developed.  HENT:     Head: Atraumatic.     Nose: Nose normal.     Mouth/Throat:     Mouth: Mucous membranes are moist.     Pharynx: Oropharynx is clear.  Eyes:     General: No scleral icterus.    Conjunctiva/sclera: Conjunctivae normal.  Neck:     Musculoskeletal: Normal range of motion and neck supple. No neck rigidity.     Trachea: No tracheal deviation.  Cardiovascular:     Rate and Rhythm: Normal rate and regular rhythm.  Pulses: Normal pulses.     Heart sounds: Normal heart sounds. No murmur. No friction rub. No gallop.   Pulmonary:     Effort: Pulmonary effort is normal. No accessory muscle usage or respiratory distress.     Breath sounds: Normal breath sounds.  Abdominal:     General: Bowel sounds are normal. There is no distension.     Palpations: Abdomen is soft.     Tenderness: There is no abdominal tenderness. There is no guarding.  Genitourinary:    Comments: No cva tenderness. Musculoskeletal:        General: No swelling.  Skin:    General: Skin is warm and dry.     Findings: No rash.  Neurological:     Mental Status: He is alert.     Comments: Alert, speech  clear.   Psychiatric:        Mood and Affect: Mood normal.      ED Treatments / Results  Labs (all labs ordered are listed, but only abnormal results are displayed) Results for orders placed or performed during the hospital encounter of 10/03/18  Comprehensive metabolic panel  Result Value Ref Range   Sodium 129 (L) 135 - 145 mmol/L   Potassium 4.2 3.5 - 5.1 mmol/L   Chloride 93 (L) 98 - 111 mmol/L   CO2 26 22 - 32 mmol/L   Glucose, Bld 348 (H) 70 - 99 mg/dL   BUN 14 6 - 20 mg/dL   Creatinine, Ser 6.211.10 0.61 - 1.24 mg/dL   Calcium 8.5 (L) 8.9 - 10.3 mg/dL   Total Protein 7.3 6.5 - 8.1 g/dL   Albumin 3.5 3.5 - 5.0 g/dL   AST 60 (H) 15 - 41 U/L   ALT 61 (H) 0 - 44 U/L   Alkaline Phosphatase 49 38 - 126 U/L   Total Bilirubin 0.9 0.3 - 1.2 mg/dL   GFR calc non Af Amer >60 >60 mL/min   GFR calc Af Amer >60 >60 mL/min   Anion gap 10 5 - 15  CBC  Result Value Ref Range   WBC 5.5 4.0 - 10.5 K/uL   RBC 5.20 4.22 - 5.81 MIL/uL   Hemoglobin 14.7 13.0 - 17.0 g/dL   HCT 30.845.1 65.739.0 - 84.652.0 %   MCV 86.7 80.0 - 100.0 fL   MCH 28.3 26.0 - 34.0 pg   MCHC 32.6 30.0 - 36.0 g/dL   RDW 96.211.7 95.211.5 - 84.115.5 %   Platelets 125 (L) 150 - 400 K/uL   nRBC 0.0 0.0 - 0.2 %  CBG monitoring, ED  Result Value Ref Range   Glucose-Capillary 353 (H) 70 - 99 mg/dL   [place] Abdomen Complete  Result Date: 10/02/2018 CLINICAL DATA:  Abdominal pain, elevated LFTs EXAM: ABDOMEN ULTRASOUND COMPLETE COMPARISON:  None. FINDINGS: Gallbladder: Incompletely distended. No gallstones, gallbladder wall thickening, pericholecystic fluid or sonographic Murphy sign. Common bile duct: Diameter: 6 mm, upper normal. Liver: Coarsened slightly increased intrahepatic echogenicity, question fatty infiltration, though this can be seen with cirrhosis and certain infiltrative disorders. No discrete intrahepatic mass or contour nodularity. No intrahepatic biliary dilatation. Portal vein is patent on color Doppler imaging with normal direction  of blood flow towards the liver. IVC: Normal appearance Pancreas: Incomplete visualization of pancreatic tail. Visualized portions of pancreas normal appearance. Spleen: Normal appearance, 12.0 cm length Right Kidney: Length: 14.6 cm. Normal morphology without mass or hydronephrosis. Left Kidney: Length: 13.5 cm. Normal morphology without mass or hydronephrosis. Abdominal aorta: Suboptimally visualized due to  bowel gas and body habitus. Other findings: No free fluid IMPRESSION: Probable fatty infiltration of liver as above. Incomplete visualization of pancreatic tail in abdominal aorta. No other definite upper abdominal sonographic abnormalities. Electronically Signed   By: Lavonia Dana M.D.   On: 10/02/2018 20:08   Dg Chest Port 1 View  Result Date: 10/02/2018 CLINICAL DATA:  Pt states he is concerned he is dehydrated. Pt states that he drank 3 gallons of water today and has been frequently voiding. Pt also states that he has a cough approx every 30 seconds. Pt HX: DM, non smoker EXAM: PORTABLE CHEST 1 VIEW COMPARISON:  08/04/2006 FINDINGS: Heart size is accentuated by portable technique. There are patchy airspace filling opacities bilaterally and predominantly involving the periphery of the lungs. No definite pleural effusions. IMPRESSION: Bilateral infiltrates, favoring infection over pulmonary edema. Electronically Signed   By: Nolon Nations M.D.   On: 10/02/2018 16:58    EKG None  Radiology US Abdomen Complete  Result Date: 10/02/2018 CLINICAL DATA:  Abdominal pain, elevated LFTs EXAM: ABDOMEN ULTRASOUND COMPLETE COMPARISON:  None. FINDINGS: Gallbladder: Incompletely distended. No gallstones, gallbladder wall thickening, pericholecystic fluid or sonographic Murphy sign. Common bile duct: Diameter: 6 mm, upper normal. Liver: Coarsened slightly increased intrahepatic echogenicity, question fatty infiltration, though this can be seen with cirrhosis and certain infiltrative disorders. No discrete  intrahepatic mass or contour nodularity. No intrahepatic biliary dilatation. Portal vein is patent on color Doppler imaging with normal direction of blood flow towards the liver. IVC: Normal appearance Pancreas: Incomplete visualization of pancreatic tail. Visualized portions of pancreas normal appearance. Spleen: Normal appearance, 12.0 cm length Right Kidney: Length: 14.6 cm. Normal morphology without mass or hydronephrosis. Left Kidney: Length: 13.5 cm. Normal morphology without mass or hydronephrosis. Abdominal aorta: Suboptimally visualized due to bowel gas and body habitus. Other findings: No free fluid IMPRESSION: Probable fatty infiltration of liver as above. Incomplete visualization of pancreatic tail in abdominal aorta. No other definite upper abdominal sonographic abnormalities. Electronically Signed   By: Lavonia Dana M.D.   On: 10/02/2018 20:08   Dg Chest Port 1 View  Result Date: 10/02/2018 CLINICAL DATA:  Pt states he is concerned he is dehydrated. Pt states that he drank 3 gallons of water today and has been frequently voiding. Pt also states that he has a cough approx every 30 seconds. Pt HX: DM, non smoker EXAM: PORTABLE CHEST 1 VIEW COMPARISON:  08/04/2006 FINDINGS: Heart size is accentuated by portable technique. There are patchy airspace filling opacities bilaterally and predominantly involving the periphery of the lungs. No definite pleural effusions. IMPRESSION: Bilateral infiltrates, favoring infection over pulmonary edema. Electronically Signed   By: Nolon Nations M.D.   On: 10/02/2018 16:58    Procedures Procedures (including critical care time)  Medications Ordered in ED Medications  sodium chloride 0.9 % bolus 1,000 mL (0 mLs Intravenous Stopped 10/03/18 1631)     Initial Impression / Assessment and Plan / ED Course  I have reviewed the triage vital signs and the nursing notes.  Pertinent labs & imaging results that were available during my care of the patient were  reviewed by me and considered in my medical decision making (see chart for details).  Iv ns bolus. Labs sent.  Reviewed nursing notes and prior charts for additional history. Yesterdays COVID test positive.  Connor Mcdonald was evaluated in Emergency Department on 10/03/2018 for the symptoms described in the history of present illness. He was evaluated in the context of the global  COVID-19 pandemic, which necessitated consideration that the patient might be at risk for infection with the SARS-CoV-2 virus that causes COVID-19. Institutional protocols and algorithms that pertain to the evaluation of patients at risk for COVID-19 are in a state of rapid change based on information released by regulatory bodies including the CDC and federal and state organizations. These policies and algorithms were followed during the patient's care in the ED.  Labs reviewed by me - hgb normal. Glucose 353.   hco3 is normal. Iv ns bolus. novolog sq.   Patient note in dka. Is breathing comfortably, rr 16, pulse ox currently 98% room air.   Rec close pcp f/u.  Covid instructions/quarantine, and return precautions provided.     Final Clinical Impressions(s) / ED Diagnoses   Final diagnoses:  None    ED Discharge Orders    None       Cathren Laine, MD 10/03/18 1658

## 2018-10-03 NOTE — ED Triage Notes (Signed)
Pt c/o increasing weakness, fever and loss of taste. Pt confirm Covid + and was seen here for same yesterday.

## 2018-10-07 LAB — CULTURE, BLOOD (ROUTINE X 2)
Culture: NO GROWTH
Culture: NO GROWTH
Special Requests: ADEQUATE
Special Requests: ADEQUATE

## 2020-06-10 IMAGING — US US ABDOMEN COMPLETE
1 series · 13 of 25 positions shown · non-contrast
Comparison: None.

CLINICAL DATA: Abdominal pain, elevated LFTs

EXAM:
ABDOMEN ULTRASOUND COMPLETE

[Series 1: us abdomen complete · 13 of 83 slices shown]
[im 1/83]
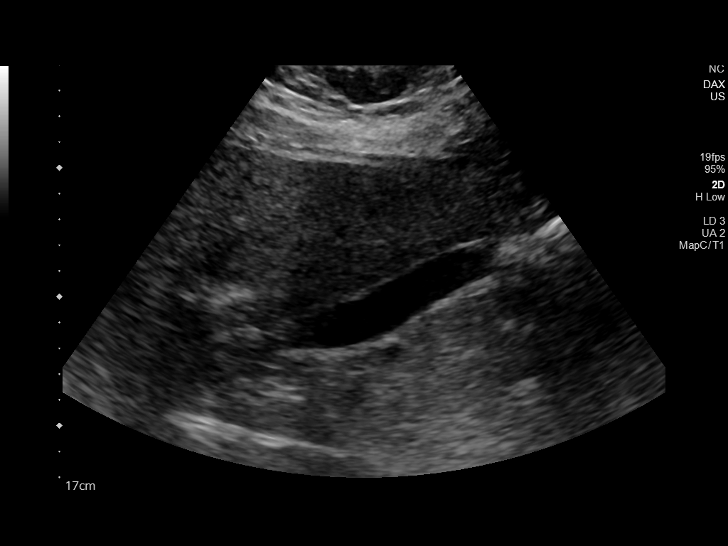
[im 7/83]
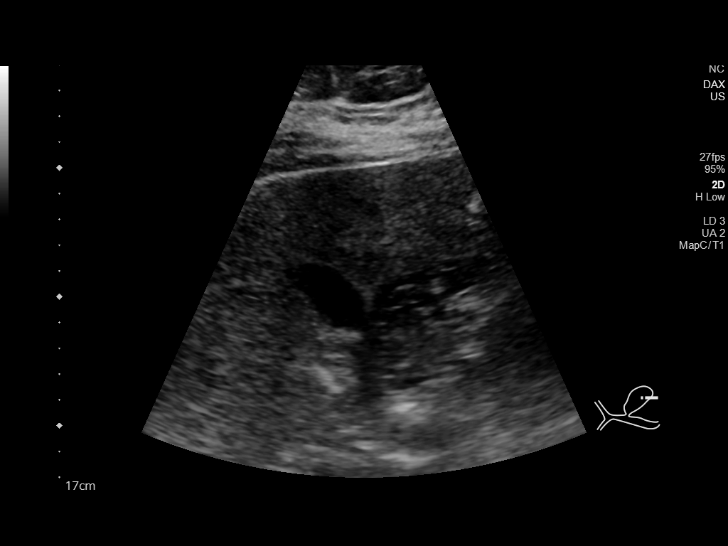
[im 14/83]
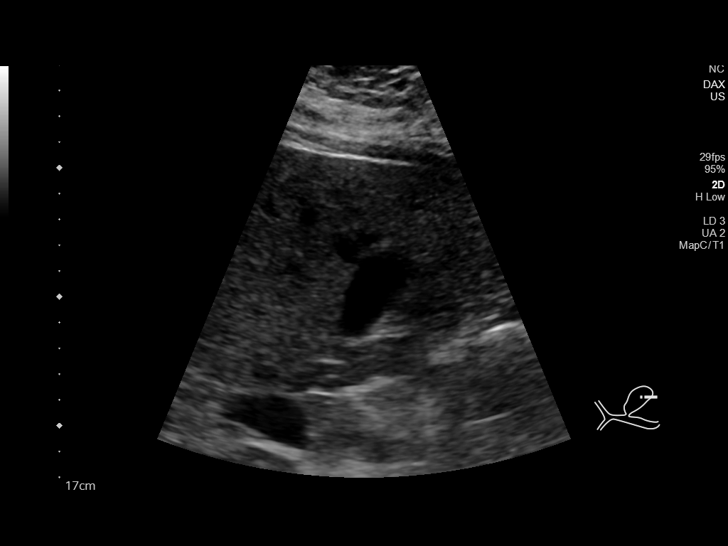
[im 21/83]
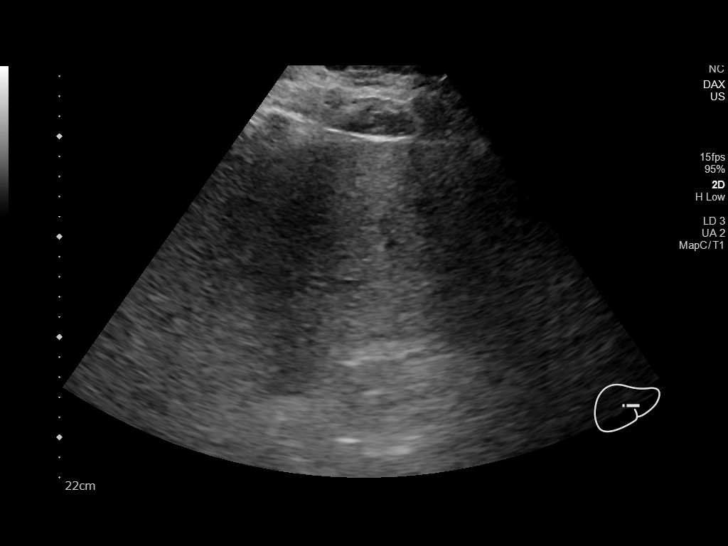
[im 28/83]
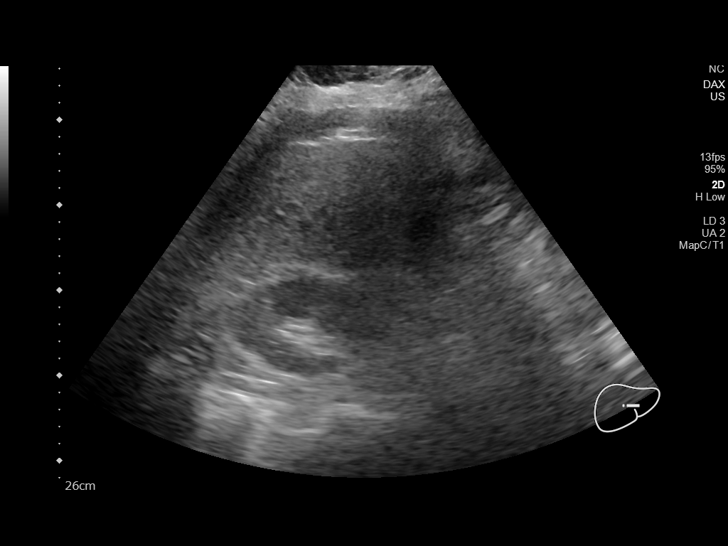
[im 35/83]
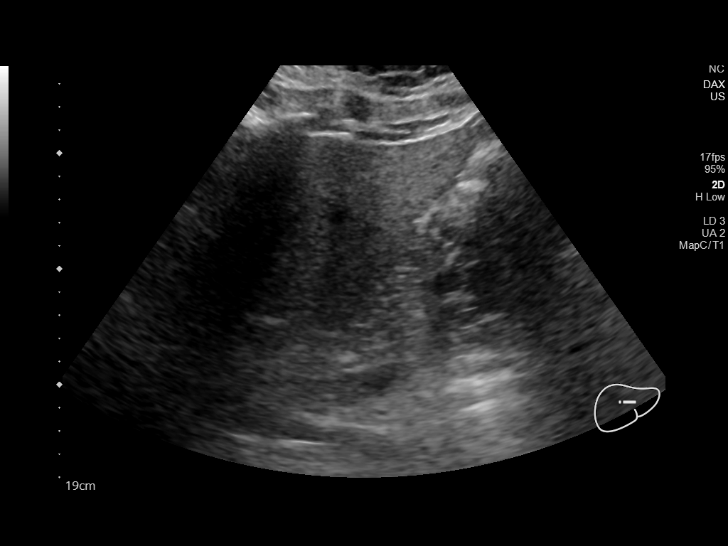
[im 42/83]
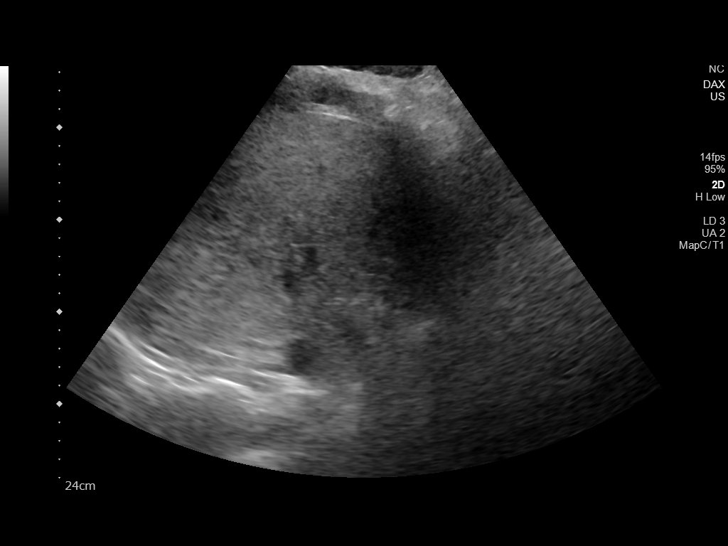
[im 48/83]
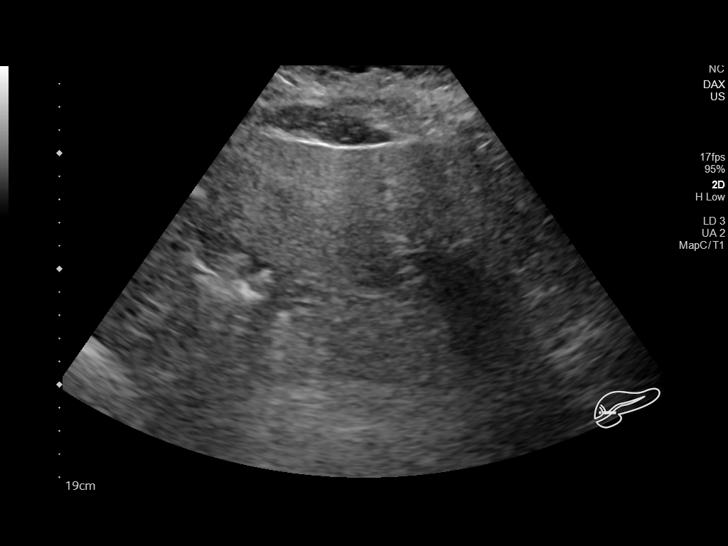
[im 55/83]
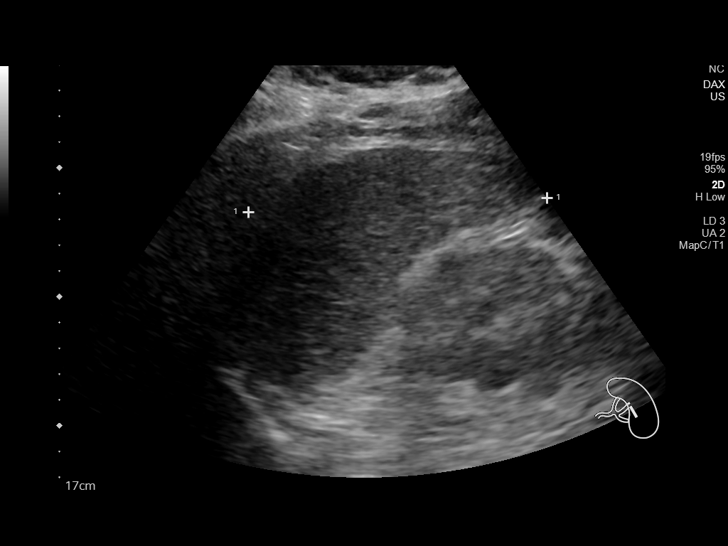
[im 62/83]
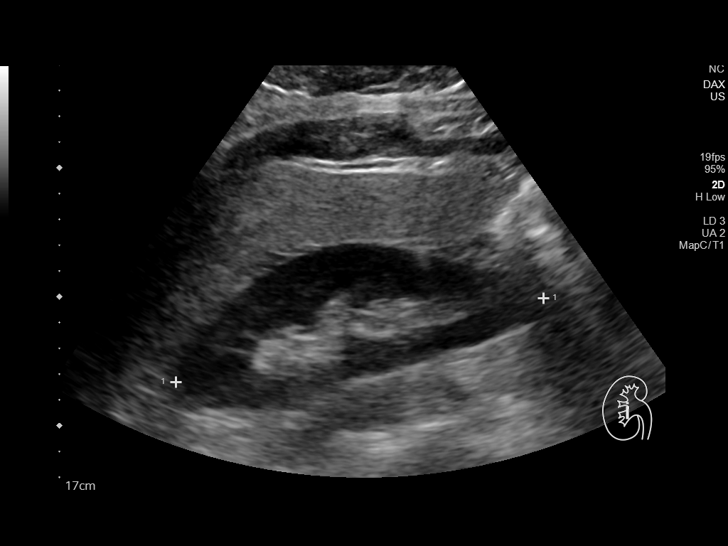
[im 69/83]
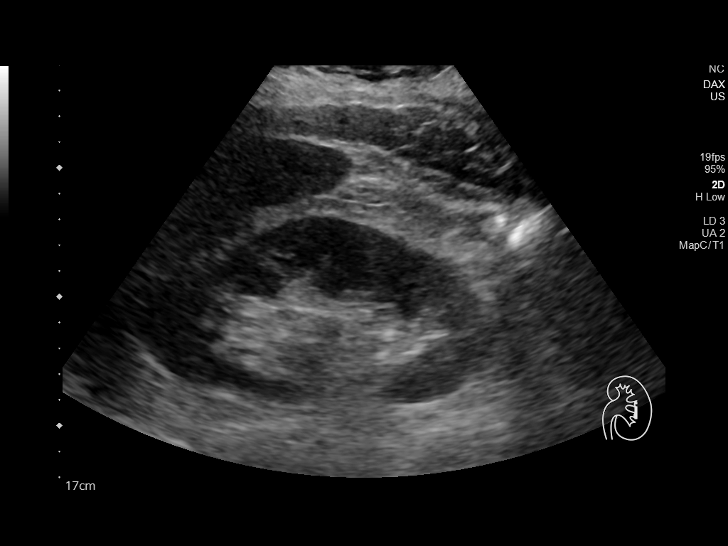
[im 76/83]
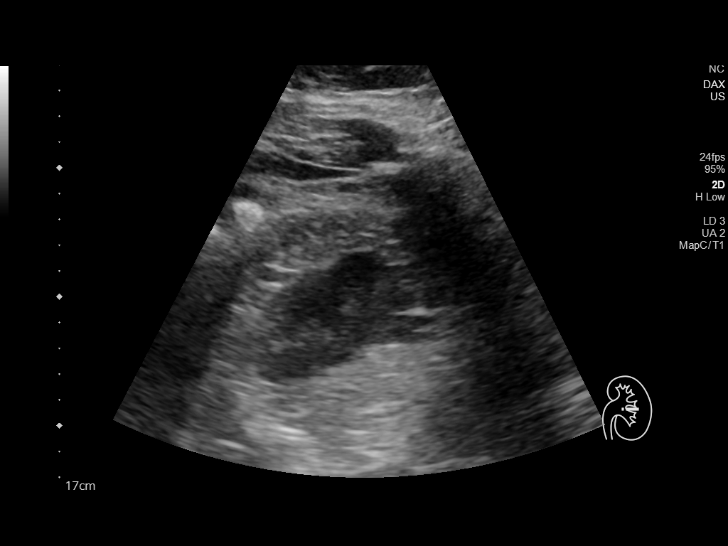
[im 83/83]
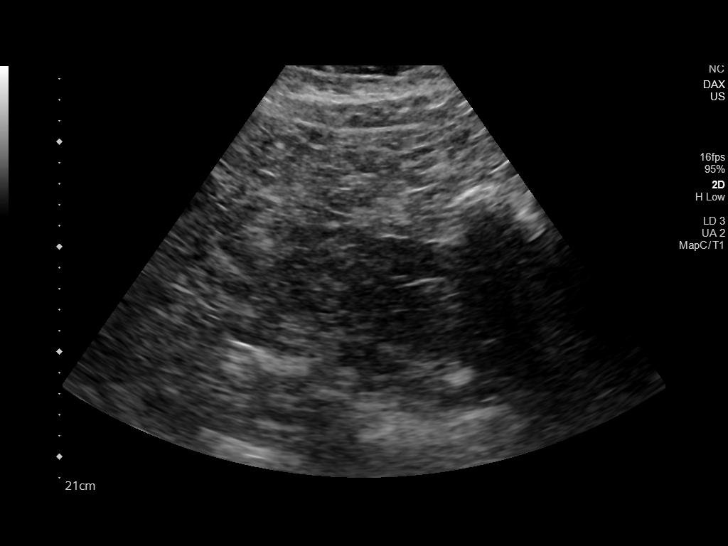

[13 of 25 positions shown; findings below may reference images not displayed]

FINDINGS: Gallbladder: Incompletely distended. No gallstones, gallbladder wall
thickening, pericholecystic fluid or sonographic Murphy sign.

Common bile duct: Diameter: 6 mm, upper normal.

Liver: Coarsened slightly increased intrahepatic echogenicity,
question fatty infiltration, though this can be seen with cirrhosis
and certain infiltrative disorders. No discrete intrahepatic mass or
contour nodularity. No intrahepatic biliary dilatation. Portal vein
is patent on color Doppler imaging with normal direction of blood
flow towards the liver.

IVC: Normal appearance

Pancreas: Incomplete visualization of pancreatic tail. Visualized
portions of pancreas normal appearance.

Spleen: Normal appearance, 12.0 cm length

Right Kidney: Length: 14.6 cm. Normal morphology without mass or
hydronephrosis.

Left Kidney: Length: 13.5 cm. Normal morphology without mass or
hydronephrosis.

Abdominal aorta: Suboptimally visualized due to bowel gas and body
habitus.

Other findings: No free fluid
IMPRESSION: Probable fatty infiltration of liver as above.

Incomplete visualization of pancreatic tail in abdominal aorta.

No other definite upper abdominal sonographic abnormalities.
